# Patient Record
Sex: Male | Born: 1995 | Race: Black or African American | Hispanic: No | Marital: Single | Smoking: Never smoker
Health system: Southern US, Community
[De-identification: ages and names within clinical notes are randomized; demographics above are authoritative.]

## PROBLEM LIST (undated history)

## (undated) DIAGNOSIS — M303 Mucocutaneous lymph node syndrome [Kawasaki]: Secondary | ICD-10-CM

## (undated) HISTORY — DX: Mucocutaneous lymph node syndrome (kawasaki): M30.3

---
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
## 2014-05-28 ENCOUNTER — Emergency Department (HOSPITAL_COMMUNITY)
Admission: EM | Admit: 2014-05-28 | Discharge: 2014-05-29 | Disposition: A | Discharge status: Home / Self Care | Payer: 59 | Attending: Emergency Medicine | Admitting: Emergency Medicine

## 2014-05-28 ENCOUNTER — Encounter (HOSPITAL_COMMUNITY): Payer: Self-pay | Admitting: Emergency Medicine

## 2014-05-28 DIAGNOSIS — Y9389 Activity, other specified: Secondary | ICD-10-CM | POA: Insufficient documentation

## 2014-05-28 DIAGNOSIS — S0100XA Unspecified open wound of scalp, initial encounter: Secondary | ICD-10-CM | POA: Insufficient documentation

## 2014-05-28 DIAGNOSIS — W3400XA Accidental discharge from unspecified firearms or gun, initial encounter: Secondary | ICD-10-CM | POA: Insufficient documentation

## 2014-05-28 DIAGNOSIS — Z23 Encounter for immunization: Secondary | ICD-10-CM | POA: Insufficient documentation

## 2014-05-28 DIAGNOSIS — Y9289 Other specified places as the place of occurrence of the external cause: Secondary | ICD-10-CM | POA: Insufficient documentation

## 2014-05-28 DIAGNOSIS — S0193XA Puncture wound without foreign body of unspecified part of head, initial encounter: Secondary | ICD-10-CM

## 2014-05-28 DIAGNOSIS — Z8679 Personal history of other diseases of the circulatory system: Secondary | ICD-10-CM | POA: Insufficient documentation

## 2014-05-28 DIAGNOSIS — J45909 Unspecified asthma, uncomplicated: Secondary | ICD-10-CM | POA: Insufficient documentation

## 2014-05-28 DIAGNOSIS — S0990XA Unspecified injury of head, initial encounter: Secondary | ICD-10-CM | POA: Insufficient documentation
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
## 2014-05-29 ENCOUNTER — Emergency Department (HOSPITAL_COMMUNITY): Payer: 59

## 2014-05-29 LAB — CBC
HCT: 42.3 % (ref 39.0–52.0)
Hemoglobin: 13.5 g/dL (ref 13.0–17.0)
MCH: 28 pg (ref 26.0–34.0)
MCHC: 31.9 g/dL (ref 30.0–36.0)
MCV: 87.6 fL (ref 78.0–100.0)
PLATELETS: 260 10*3/uL (ref 150–400)
RBC: 4.83 MIL/uL (ref 4.22–5.81)
RDW: 13.1 % (ref 11.5–15.5)
WBC: 4.6 10*3/uL (ref 4.0–10.5)

## 2014-05-29 LAB — PREPARE FRESH FROZEN PLASMA
UNIT DIVISION: 0
Unit division: 0

## 2014-05-29 LAB — COMPREHENSIVE METABOLIC PANEL
ALK PHOS: 76 U/L (ref 39–117)
ALT: 66 U/L — AB (ref 0–53)
AST: 37 U/L (ref 0–37)
Albumin: 4 g/dL (ref 3.5–5.2)
Anion gap: 13 (ref 5–15)
BUN: 11 mg/dL (ref 6–23)
CHLORIDE: 100 meq/L (ref 96–112)
CO2: 25 meq/L (ref 19–32)
Calcium: 9.6 mg/dL (ref 8.4–10.5)
Creatinine, Ser: 0.97 mg/dL (ref 0.50–1.35)
GLUCOSE: 100 mg/dL — AB (ref 70–99)
POTASSIUM: 4 meq/L (ref 3.7–5.3)
SODIUM: 138 meq/L (ref 137–147)
Total Bilirubin: 0.3 mg/dL (ref 0.3–1.2)
Total Protein: 7.4 g/dL (ref 6.0–8.3)

## 2014-05-29 LAB — CDS SEROLOGY

## 2014-05-29 LAB — SAMPLE TO BLOOD BANK

## 2014-05-29 LAB — ETHANOL

## 2014-05-29 LAB — PROTIME-INR
INR: 0.97 (ref 0.00–1.49)
Prothrombin Time: 12.9 seconds (ref 11.6–15.2)

## 2014-05-29 MED ORDER — TETANUS-DIPHTH-ACELL PERTUSSIS 5-2.5-18.5 LF-MCG/0.5 IM SUSP
INTRAMUSCULAR | Status: AC
  Administered 2014-05-29: 0.5 mL via INTRAMUSCULAR
  Filled 2014-05-29: qty 0.5

## 2014-05-29 MED ORDER — TETANUS-DIPHTHERIA TOXOIDS TD 5-2 LFU IM INJ: 0.5000 mL | INJECTION | Freq: Once | INTRAMUSCULAR | Status: DC

## 2014-05-29 MED ORDER — ACETAMINOPHEN 325 MG PO TABS: 650.0000 mg | ORAL_TABLET | Freq: Once | ORAL | Status: DC
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
## 2014-05-29 NOTE — ED Notes (Signed)
Patient discharged with all personal belongings. 
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
## 2014-05-31 LAB — BLOOD PRODUCT ORDER (VERBAL) VERIFICATION

## 2015-02-15 ENCOUNTER — Other Ambulatory Visit (HOSPITAL_COMMUNITY)
Admission: RE | Admit: 2015-02-15 | Discharge: 2015-02-15 | Disposition: A | Discharge status: Home / Self Care | Payer: Self-pay | Source: Ambulatory Visit | Attending: Family Medicine | Admitting: Family Medicine

## 2015-02-15 ENCOUNTER — Emergency Department (INDEPENDENT_AMBULATORY_CARE_PROVIDER_SITE_OTHER)
Admission: EM | Admit: 2015-02-15 | Discharge: 2015-02-15 | Disposition: A | Discharge status: Home / Self Care | Payer: Self-pay | Source: Home / Self Care | Attending: Family Medicine | Admitting: Family Medicine

## 2015-02-15 ENCOUNTER — Encounter (HOSPITAL_COMMUNITY): Payer: Self-pay | Admitting: Family Medicine

## 2015-02-15 DIAGNOSIS — Z202 Contact with and (suspected) exposure to infections with a predominantly sexual mode of transmission: Secondary | ICD-10-CM

## 2015-02-15 DIAGNOSIS — Z113 Encounter for screening for infections with a predominantly sexual mode of transmission: Secondary | ICD-10-CM | POA: Insufficient documentation

## 2015-02-15 MED ORDER — AZITHROMYCIN 250 MG PO TABS
1000.0000 mg | ORAL_TABLET | Freq: Once | ORAL | Status: AC
  Administered 2015-02-15: 1000 mg via ORAL

## 2015-02-15 MED ORDER — AZITHROMYCIN 250 MG PO TABS
ORAL_TABLET | ORAL | Status: AC
  Filled 2015-02-15: qty 4

## 2015-02-15 NOTE — ED Provider Notes (Signed)
CSN: [id number]     Arrival date & time 02/15/15  0914 History   First MD Initiated Contact with Patient 02/15/15 [phone]     Chief Complaint  Patient presents with  . Exposure to STD   (Consider location/radiation/quality/duration/timing/severity/associated sxs/prior Treatment) HPI   Last sexual intercourse 3 months ago. Sexual partner called him up yesterday and says that she has chlamydia. Patient is symptom free at this time. Patient endorses intercourse without a condom. Denies penile discharge, penile pain, groin adenopathy, groin or penile lesions, fevers, despite weight loss, night sweats, rash, nausea, vomiting, abdominal pain, dysuria, or frequency.   Past Medical History  Diagnosis Date  . Kawasaki disease   . Asthma     as a  child not tx now    History reviewed. No pertinent past surgical history. Family History  Problem Relation Age of Onset  . Diabetes    . Hypertension     History  Substance Use Topics  . Smoking status: Never Smoker   . Smokeless tobacco: Not on file  . Alcohol Use: Not on file    Review of Systems Per HPI with all other pertinent systems negative.   Allergies  Review of patient's allergies indicates no known allergies.  Home Medications   Prior to Admission medications   Medication Sig Start Date End Date Taking? Authorizing Provider  acetaminophen (TYLENOL) 325 MG tablet Take 650 mg by mouth every 6 (six) hours as needed for headache.    Historical Provider, MD   BP 181/78 mmHg  Pulse 89  Temp(Src) 98.4 F (36.9 C) (Oral)  Resp 16  SpO2 96% Physical Exam Physical Exam  Constitutional: oriented to person, place, and time. appears well-developed and well-nourished. No distress.  HENT:  Head: Normocephalic and atraumatic.  Eyes: EOMI. PERRL.  Neck: Normal range of motion.  Pulmonary/Chest: Effort normal and breath sounds normal. No respiratory distress.  Abdominal: , no distension.  Musculoskeletal: Normal range of motion. Non  ttp, no effusion.  Neurological: alert and oriented to person, place, and time.  Skin: Skin is warm. No rash noted. non diaphoretic.  Psychiatric: normal mood and affect. behavior is normal. Judgment and thought content normal.   ED Course  Procedures (including critical care time) Labs Review Labs Reviewed  HIV ANTIBODY (ROUTINE TESTING)  RPR  URINE CYTOLOGY ANCILLARY ONLY    Imaging Review No results found.   MDM   1. Exposure to STD    Azithromycin 1000 mg by mouth an office Full STD panel including HIV and RPR. Discussed a sex practices.    Ozella Rocksavid J Lateef Juncaj, MD 02/15/15 1000

## 2015-02-15 NOTE — ED Notes (Addendum)
Requesting to be tested for STD. Sexually active w/o condoms, DENIES ANY SYMPTOMS. Informed provider his girlfriend has chlamydia

## 2015-02-15 NOTE — ED Notes (Signed)
Cal back number for lab issues verified  

## 2015-02-15 NOTE — Discharge Instructions (Signed)
You were treated for a possible Chlamydia infection. We will test for this and other forms of STDs. If any results come back positive we will call urology note. Please do not have any intercourse until your full lab results come back. Please Dyke BrackettReimer to always protect from STDs by using condoms.

## 2015-02-16 LAB — URINE CYTOLOGY ANCILLARY ONLY
CHLAMYDIA, DNA PROBE: NEGATIVE
Neisseria Gonorrhea: NEGATIVE
Trichomonas: NEGATIVE

## 2015-02-16 LAB — RPR: RPR: NONREACTIVE

## 2015-02-16 LAB — HIV ANTIBODY (ROUTINE TESTING W REFLEX): HIV Screen 4th Generation wRfx: NONREACTIVE
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
## 2017-06-26 ENCOUNTER — Encounter: Payer: Self-pay | Admitting: Family Medicine
